# Patient Record
Sex: Female | Born: 1990 | Race: Black or African American | Hispanic: No | Marital: Single | Smoking: Never smoker
Health system: Southern US, Community
[De-identification: ages and names within clinical notes are randomized; demographics above are authoritative.]

---

## 2011-05-13 ENCOUNTER — Emergency Department: Payer: Self-pay | Admitting: Emergency Medicine

## 2014-11-13 ENCOUNTER — Emergency Department: Payer: Self-pay | Admitting: Emergency Medicine

## 2017-06-10 ENCOUNTER — Encounter: Payer: Self-pay | Admitting: Emergency Medicine

## 2017-06-10 ENCOUNTER — Emergency Department: Payer: Self-pay

## 2017-06-10 ENCOUNTER — Emergency Department
Admission: EM | Admit: 2017-06-10 | Discharge: 2017-06-10 | Disposition: A | Discharge status: Home / Self Care | Payer: Self-pay | Attending: Emergency Medicine | Admitting: Emergency Medicine

## 2017-06-10 DIAGNOSIS — S0990XA Unspecified injury of head, initial encounter: Secondary | ICD-10-CM

## 2017-06-10 DIAGNOSIS — T07XXXA Unspecified multiple injuries, initial encounter: Secondary | ICD-10-CM

## 2017-06-10 DIAGNOSIS — Y939 Activity, unspecified: Secondary | ICD-10-CM | POA: Insufficient documentation

## 2017-06-10 DIAGNOSIS — Y999 Unspecified external cause status: Secondary | ICD-10-CM | POA: Insufficient documentation

## 2017-06-10 DIAGNOSIS — Y929 Unspecified place or not applicable: Secondary | ICD-10-CM | POA: Insufficient documentation

## 2017-06-10 DIAGNOSIS — S0093XA Contusion of unspecified part of head, initial encounter: Secondary | ICD-10-CM | POA: Insufficient documentation

## 2017-06-10 DIAGNOSIS — S8011XA Contusion of right lower leg, initial encounter: Secondary | ICD-10-CM | POA: Insufficient documentation

## 2017-06-10 DIAGNOSIS — M542 Cervicalgia: Secondary | ICD-10-CM | POA: Insufficient documentation

## 2017-06-10 MED ORDER — TRAMADOL HCL 50 MG PO TABS: 50.0000 mg | ORAL_TABLET | Freq: Four times a day (QID) | ORAL | 0 refills | Status: DC | PRN

## 2017-06-10 MED ORDER — OXYCODONE-ACETAMINOPHEN 5-325 MG PO TABS
1.0000 | ORAL_TABLET | Freq: Once | ORAL | Status: AC
  Administered 2017-06-10: 1 via ORAL
  Filled 2017-06-10: qty 1

## 2017-06-10 MED ORDER — KETOROLAC TROMETHAMINE 60 MG/2ML IM SOLN
60.0000 mg | Freq: Once | INTRAMUSCULAR | Status: AC
  Administered 2017-06-10: 60 mg via INTRAMUSCULAR
  Filled 2017-06-10: qty 2
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
## 2017-06-10 NOTE — ED Notes (Signed)
Patient transported to X-ray 

## 2017-06-10 NOTE — Discharge Instructions (Signed)
Please follow up with the acute care clinic for further evaluation.  °
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
## 2018-10-11 ENCOUNTER — Emergency Department
Admission: EM | Admit: 2018-10-11 | Discharge: 2018-10-11 | Disposition: A | Discharge status: Home / Self Care | Payer: Managed Care, Other (non HMO) | Attending: Emergency Medicine | Admitting: Emergency Medicine

## 2018-10-11 ENCOUNTER — Other Ambulatory Visit: Payer: Self-pay

## 2018-10-11 ENCOUNTER — Encounter: Payer: Self-pay | Admitting: Emergency Medicine

## 2018-10-11 DIAGNOSIS — N946 Dysmenorrhea, unspecified: Secondary | ICD-10-CM | POA: Diagnosis present

## 2018-10-11 MED ORDER — NAPROXEN 500 MG PO TBEC: 500.0000 mg | DELAYED_RELEASE_TABLET | Freq: Two times a day (BID) | ORAL | 0 refills | Status: AC

## 2018-10-11 NOTE — ED Notes (Addendum)
See triage note    Always has bad pain with periods.  This is normal period cramps.  No difference other period cramps. Does not see OBGYN.  No other symptoms at all. No urinary symptoms states she has tried OTC ibu w/o relief

## 2018-10-11 NOTE — ED Triage Notes (Signed)
Says here for menstrual pains

## 2018-10-11 NOTE — Discharge Instructions (Addendum)
Follow-up with your regular doctor if not better in 3 days.  Call the GYN doctor for an appointment.  Take medications as prescribed.  Be active as this will help decrease menstrual cramps.

## 2018-10-11 NOTE — ED Provider Notes (Signed)
[facility] Emergency Department Provider Note  ____________________________________________   First MD Initiated Contact with Patient 10/11/18 1238     (approximate)  I have reviewed the triage vital signs and the nursing notes.   HISTORY  Chief Complaint period cramps    HPI Stacey Carr is a 28 y.o. female presents emergency department complaining of menstrual cramps.  She states that she is always had bad cramps with her periods.  She states she is just tired of hurting and wants to figure out why she does this every month.  She states she recently got insurance and thinks that now she should see OB/GYN.  She has never had a Pap smear before she is denying any vaginal discharge, fever, chills, or upper abdominal pain.  Cramping this time is no different than any other time    History reviewed. No pertinent past medical history.  There are no active problems to display for this patient.   History reviewed. No pertinent surgical history.  Prior to Admission medications   Medication Sig Start Date End Date Taking? Authorizing Provider  naproxen (EC NAPROSYN) 500 MG EC tablet Take 1 tablet (500 mg total) by mouth 2 (two) times daily with a meal. 10/11/18   Sherrie Mustache Roselyn Bering, PA-C    Allergies Patient has no known allergies.  History reviewed. No pertinent family history.  Social History Social History   Tobacco Use  . Smoking status: Never Smoker  . Smokeless tobacco: Never Used  Substance Use Topics  . Alcohol use: No  . Drug use: Not on file    Review of Systems  Constitutional: No fever/chills Eyes: No visual changes. ENT: No sore throat. Respiratory: Denies cough Genitourinary: Negative for dysuria.  Positive for menstrual cramps Musculoskeletal: Negative for back pain. Skin: Negative for rash.    ____________________________________________   PHYSICAL EXAM:  VITAL SIGNS: ED Triage Vitals  Enc Vitals Group     BP  10/11/18 1226 (!) 168/86     Pulse Rate 10/11/18 1226 69     Resp 10/11/18 1226 18     Temp 10/11/18 1226 98.5 F (36.9 C)     Temp Source 10/11/18 1226 Oral     SpO2 10/11/18 1226 100 %     Weight 10/11/18 1227 210 lb (95.3 kg)     Height 10/11/18 1227 5\' 6"  (1.676 m)     Head Circumference --      Peak Flow --      Pain Score 10/11/18 1226 8     Pain Loc --      Pain Edu? --      Excl. in GC? --     Constitutional: Alert and oriented. Well appearing and in no acute distress. Eyes: Conjunctivae are normal.  Head: Atraumatic. Nose: No congestion/rhinnorhea. Mouth/Throat: Mucous membranes are moist.   Cardiovascular: Normal rate, regular rhythm. Heart sounds are normal Respiratory: Normal respiratory effort.  No retractions, lungs c t a  Abd: soft nontender bs normal all 4 quad GU: deferred Musculoskeletal: FROM all extremities, warm and well perfused Neurologic:  Normal speech and language.  Skin:  Skin is warm, dry and intact. No rash noted. Psychiatric: Mood and affect are normal. Speech and behavior are normal.  ____________________________________________   LABS (all labs ordered are listed, but only abnormal results are displayed)  Labs Reviewed - No data to display ____________________________________________   ____________________________________________  RADIOLOGY    ____________________________________________   PROCEDURES  Procedure(s) performed: No  Procedures  ____________________________________________   INITIAL IMPRESSION / ASSESSMENT AND PLAN / ED COURSE  Pertinent labs & imaging results that were available during my care of the patient were reviewed by me and considered in my medical decision making (see chart for details).   Patient is a 28 year old female presents emergency department with concerns of continued menstrual cramping.  Menstrual cramps are the same as they have been since she started her periods.  No change other than  she recently acquired insurance and wants to be evaluated.  Physical exam is unremarkable  Explained to the patient that she needs to see OB/GYN doctor.  She is denying any signs of STDs or infection so therefore OB/GYN referral is appropriate at this time.  I switched her ibuprofen to naproxen 500 twice daily.  Explained menstrual cramps and how they occur.  She is to follow-up with Dr. Bonney Aid.  She is to call for an appointment.  She states she understands will comply.  She was given a work note and discharged in stable condition.     As part of my medical decision making, I reviewed the following data within the electronic MEDICAL RECORD NUMBER Nursing notes reviewed and incorporated, Old chart reviewed, Notes from prior ED visits and [place] Controlled Substance Database  ____________________________________________   FINAL CLINICAL IMPRESSION(S) / ED DIAGNOSES  Final diagnoses:  Menstrual cramp      NEW MEDICATIONS STARTED DURING THIS VISIT:  Discharge Medication List as of 10/11/2018  1:03 PM    START taking these medications   Details  naproxen (EC NAPROSYN) 500 MG EC tablet Take 1 tablet (500 mg total) by mouth 2 (two) times daily with a meal., Starting Mon 10/11/2018, Normal         Note:  This document was prepared using Dragon voice recognition software and may include unintentional dictation errors.    Faythe Ghee, PA-C 10/11/18 1343    Schaevitz, Myra Rude, MD 10/11/18 [phone]

## 2018-10-11 NOTE — ED Triage Notes (Signed)
Here for pain r/t period cramps.  Always has bad pain with periods.  This is normal period cramps.  No difference other period cramps. Does not see OBGYN.  No other symptoms at all. No urinary symptoms.  Has tried tylenol and motrin.  This is "normal period" for pt.  No other concerns except would like help with cramps from period.

## 2019-03-23 ENCOUNTER — Other Ambulatory Visit: Payer: Self-pay | Admitting: Internal Medicine

## 2019-03-23 DIAGNOSIS — Z20822 Contact with and (suspected) exposure to covid-19: Secondary | ICD-10-CM

## 2019-03-27 LAB — NOVEL CORONAVIRUS, NAA: SARS-CoV-2, NAA: NOT DETECTED
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
## 2023-08-09 NOTE — Progress Notes (Unsigned)
New patient visit  Patient: Stacey Carr   DOB: July 10, 1991   32 y.o. Female  MRN: [id number] Visit Date: 08/13/2023  Today's healthcare provider: Debera Lat, PA-C   No chief complaint on file.  Subjective    Stacey Carr is a 32 y.o. female who presents today as a new patient to establish care.  HPI  *** Discussed the use of AI scribe software for clinical note transcription with the patient, who gave verbal consent to proceed.  History of Present Illness            No past medical history on file. No past surgical history on file. No family status information on file.   No family history on file. Social History   Socioeconomic History  . Marital status: Single    Spouse name: Not on file  . Number of children: Not on file  . Years of education: Not on file  . Highest education level: Not on file  Occupational History  . Not on file  Tobacco Use  . Smoking status: Never  . Smokeless tobacco: Never  Substance and Sexual Activity  . Alcohol use: No  . Drug use: Not on file  . Sexual activity: Not on file  Other Topics Concern  . Not on file  Social History Narrative  . Not on file   Social Determinants of Health   Financial Resource Strain: Not on file  Food Insecurity: Not on file  Transportation Needs: Not on file  Physical Activity: Not on file  Stress: Not on file  Social Connections: Not on file   Outpatient Medications Prior to Visit  Medication Sig  . naproxen (EC NAPROSYN) 500 MG EC tablet Take 1 tablet (500 mg total) by mouth 2 (two) times daily with a meal.   No facility-administered medications prior to visit.   No Known Allergies   There is no immunization history on file for this patient.  Health Maintenance  Topic Date Due  . HIV Screening  Never done  . Hepatitis C Screening  Never done  . DTaP/Tdap/Td (1 - Tdap) Never done  . Cervical Cancer Screening (HPV/Pap Cotest)  Never done  . INFLUENZA VACCINE  Never done  .  COVID-19 Vaccine (1 - 2023-24 season) Never done  . HPV VACCINES  Aged Out    Patient Care Team: Patient, No Pcp Per as PCP - General (General Practice)  Review of Systems  All other systems reviewed and are negative. Except see HPI   {Insert previous labs (optional):23779} {See past labs  Heme  Chem  Endocrine  Serology  Results Review (optional):1}   Objective    There were no vitals taken for this visit. {Insert last BP/Wt (optional):23777}{See vitals history (optional):1}   Physical Exam Vitals reviewed.  Constitutional:      General: She is not in acute distress.    Appearance: Normal appearance. She is well-developed. She is not diaphoretic.  HENT:     Head: Normocephalic and atraumatic.  Eyes:     General: No scleral icterus.    Conjunctiva/sclera: Conjunctivae normal.  Neck:     Thyroid: No thyromegaly.  Cardiovascular:     Rate and Rhythm: Normal rate and regular rhythm.     Pulses: Normal pulses.     Heart sounds: Normal heart sounds. No murmur heard. Pulmonary:     Effort: Pulmonary effort is normal. No respiratory distress.     Breath sounds: Normal breath sounds. No wheezing, rhonchi or rales.  Musculoskeletal:     Cervical back: Neck supple.     Right lower leg: No edema.     Left lower leg: No edema.  Lymphadenopathy:     Cervical: No cervical adenopathy.  Skin:    General: Skin is warm and dry.     Findings: No rash.  Neurological:     Mental Status: She is alert and oriented to person, place, and time. Mental status is at baseline.  Psychiatric:        Mood and Affect: Mood normal.        Behavior: Behavior normal.   Depression Screen     No data to display         No results found for any visits on 08/13/23.  Assessment & Plan     *** Assessment and Plan              Encounter to establish care Welcomed to our clinic Reviewed past medical hx, social hx, family hx and surgical hx Pt advised to send all vaccination  records or screening   No follow-ups on file.     [facility]

## 2023-08-13 ENCOUNTER — Ambulatory Visit: Payer: Self-pay | Admitting: Physician Assistant
